# Patient Record
Sex: Female | Born: 1980 | Race: White | Hispanic: No | Marital: Married | Smoking: Never smoker
Health system: Southern US, Community
[De-identification: ages and names within clinical notes are randomized; demographics above are authoritative.]

## PROBLEM LIST (undated history)

## (undated) DIAGNOSIS — J45909 Unspecified asthma, uncomplicated: Secondary | ICD-10-CM

---

## 2008-05-15 ENCOUNTER — Inpatient Hospital Stay (HOSPITAL_COMMUNITY): Admission: RE | Admit: 2008-05-15 | Discharge: 2008-05-17 | Payer: Self-pay | Admitting: Obstetrics and Gynecology

## 2010-04-06 ENCOUNTER — Inpatient Hospital Stay (HOSPITAL_COMMUNITY): Admission: AD | Admit: 2010-04-06 | Discharge: 2010-04-06 | Payer: Self-pay | Admitting: Obstetrics

## 2010-06-20 ENCOUNTER — Inpatient Hospital Stay (HOSPITAL_COMMUNITY): Admission: AD | Admit: 2010-06-20 | Discharge: 2010-06-20 | Payer: Self-pay | Admitting: Obstetrics and Gynecology

## 2010-07-09 ENCOUNTER — Inpatient Hospital Stay (HOSPITAL_COMMUNITY)
Admission: RE | Admit: 2010-07-09 | Discharge: 2010-07-11 | Payer: Self-pay | Source: Home / Self Care | Attending: Obstetrics and Gynecology | Admitting: Obstetrics and Gynecology

## 2010-10-13 LAB — CBC
HCT: 31.9 % — ABNORMAL LOW (ref 36.0–46.0)
HCT: 35.2 % — ABNORMAL LOW (ref 36.0–46.0)
MCH: 31.5 pg (ref 26.0–34.0)
MCHC: 34 g/dL (ref 30.0–36.0)
MCHC: 34.5 g/dL (ref 30.0–36.0)
MCV: 91.4 fL (ref 78.0–100.0)
MCV: 92.2 fL (ref 78.0–100.0)
RDW: 13.4 % (ref 11.5–15.5)
RDW: 13.7 % (ref 11.5–15.5)
WBC: 12.2 10*3/uL — ABNORMAL HIGH (ref 4.0–10.5)

## 2010-12-15 NOTE — H&P (Signed)
NAME:  Mallory Dunn, Mallory Dunn               ACCOUNT NO.:  [id number]   MEDICAL RECORD NO.:  [id number]          PATIENT TYPE:  INP   LOCATION:  9165                          FACILITY:  [facility]   PHYSICIAN:  Lenoard Aden, M.D.DATE OF BIRTH:  [birth year]   DATE OF ADMISSION:  05/15/2008  DATE OF DISCHARGE:                              HISTORY & PHYSICAL   CHIEF COMPLAINT:  Favorable cervix, multiple musculoskeletal complaints,  for induction.   HISTORY OF PRESENT ILLNESS:  The patient is a 27-year white female G2,  P1 with a history of LGA and difficult delivery currently at 39 weeks  with favorable cervix desiring induction.   PAST MEDICAL HISTORY:  Remarkable for a vaginal delivery.   ALLERGIES:  She has no known drug allergies.   MEDICATIONS:  Prenatal vitamins.   FAMILY HISTORY:  She has a family history of heart disease, diabetes and  breast cancer.  First child was 8 pounds 9 ounces at 39 weeks.   PHYSICAL EXAMINATION:  GENERAL:  She is a well-developed, well-nourished  white female in no acute distress.  HEENT: Normal.  LUNGS: Clear.  HEART: Regular rate and rhythm.  ABDOMEN:  Soft, gravid, nontender.  Estimated fetal weight 8.8 pounds.  Cervix is 3, 50%, vertex -1, soft, anterior.  EXTREMITIES: There are no cords.  NEUROLOGIC: Nonfocal.  SKIN: Intact.   IMPRESSION:  Term intrauterine pregnancy at 39 weeks with multiple  musculoskeletal complaints and concerns about previous difficult  delivery, now with favorable cervix for induction 39 weeks.   PLAN:  Proceed with induction.  Pitocin epidural as needed.      Lenoard Aden, M.D.  Electronically Signed     RJT/MEDQ  D:  05/15/2008  T:  05/15/2008  Job:  [id number]

## 2011-05-04 LAB — CBC
Hemoglobin: 11.5 — ABNORMAL LOW
MCHC: 34.1
MCV: 92.7
Platelets: 128 — ABNORMAL LOW
RDW: 13.9
WBC: 12.9 — ABNORMAL HIGH

## 2012-01-04 IMAGING — CR DG CHEST 2V
2 series · 2 of 2 positions shown · non-contrast
Comparison: None

CLINICAL DATA: Cough and chest pain

CHEST - 2 VIEW

[view not recorded (1 of 2)]
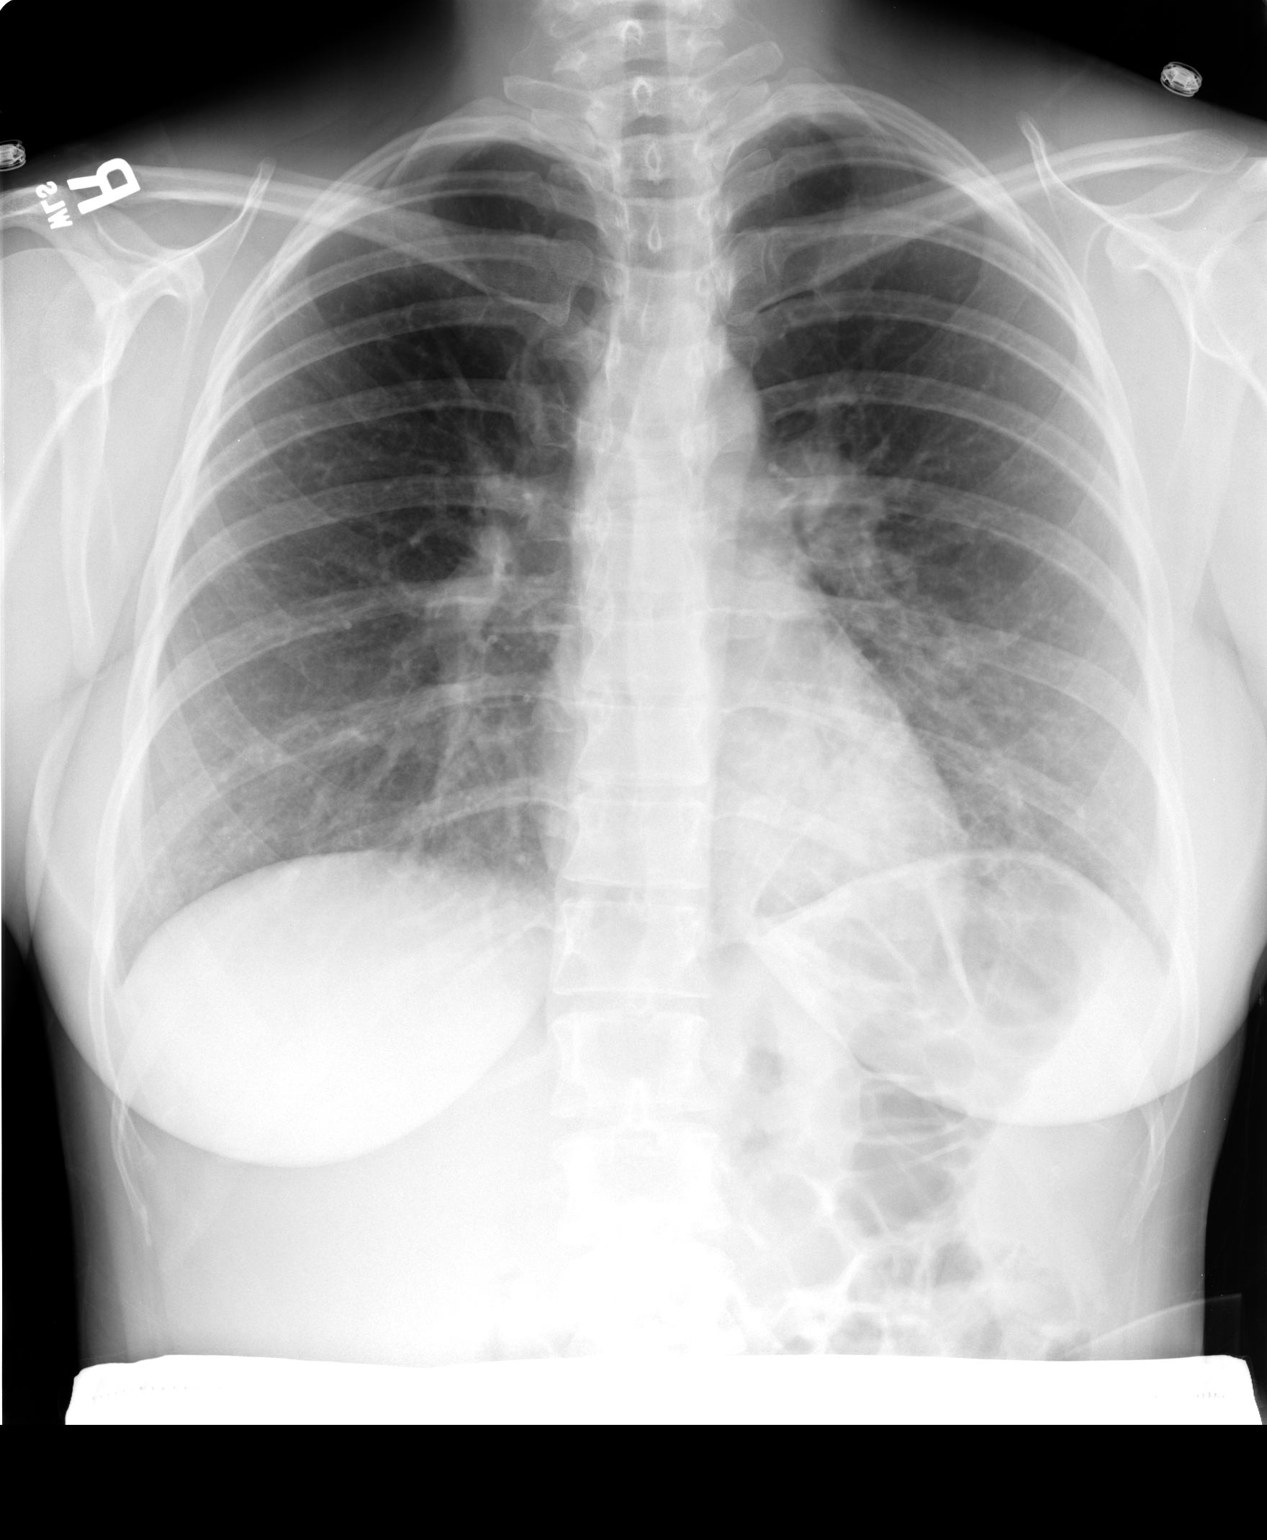

[view not recorded (2 of 2)]
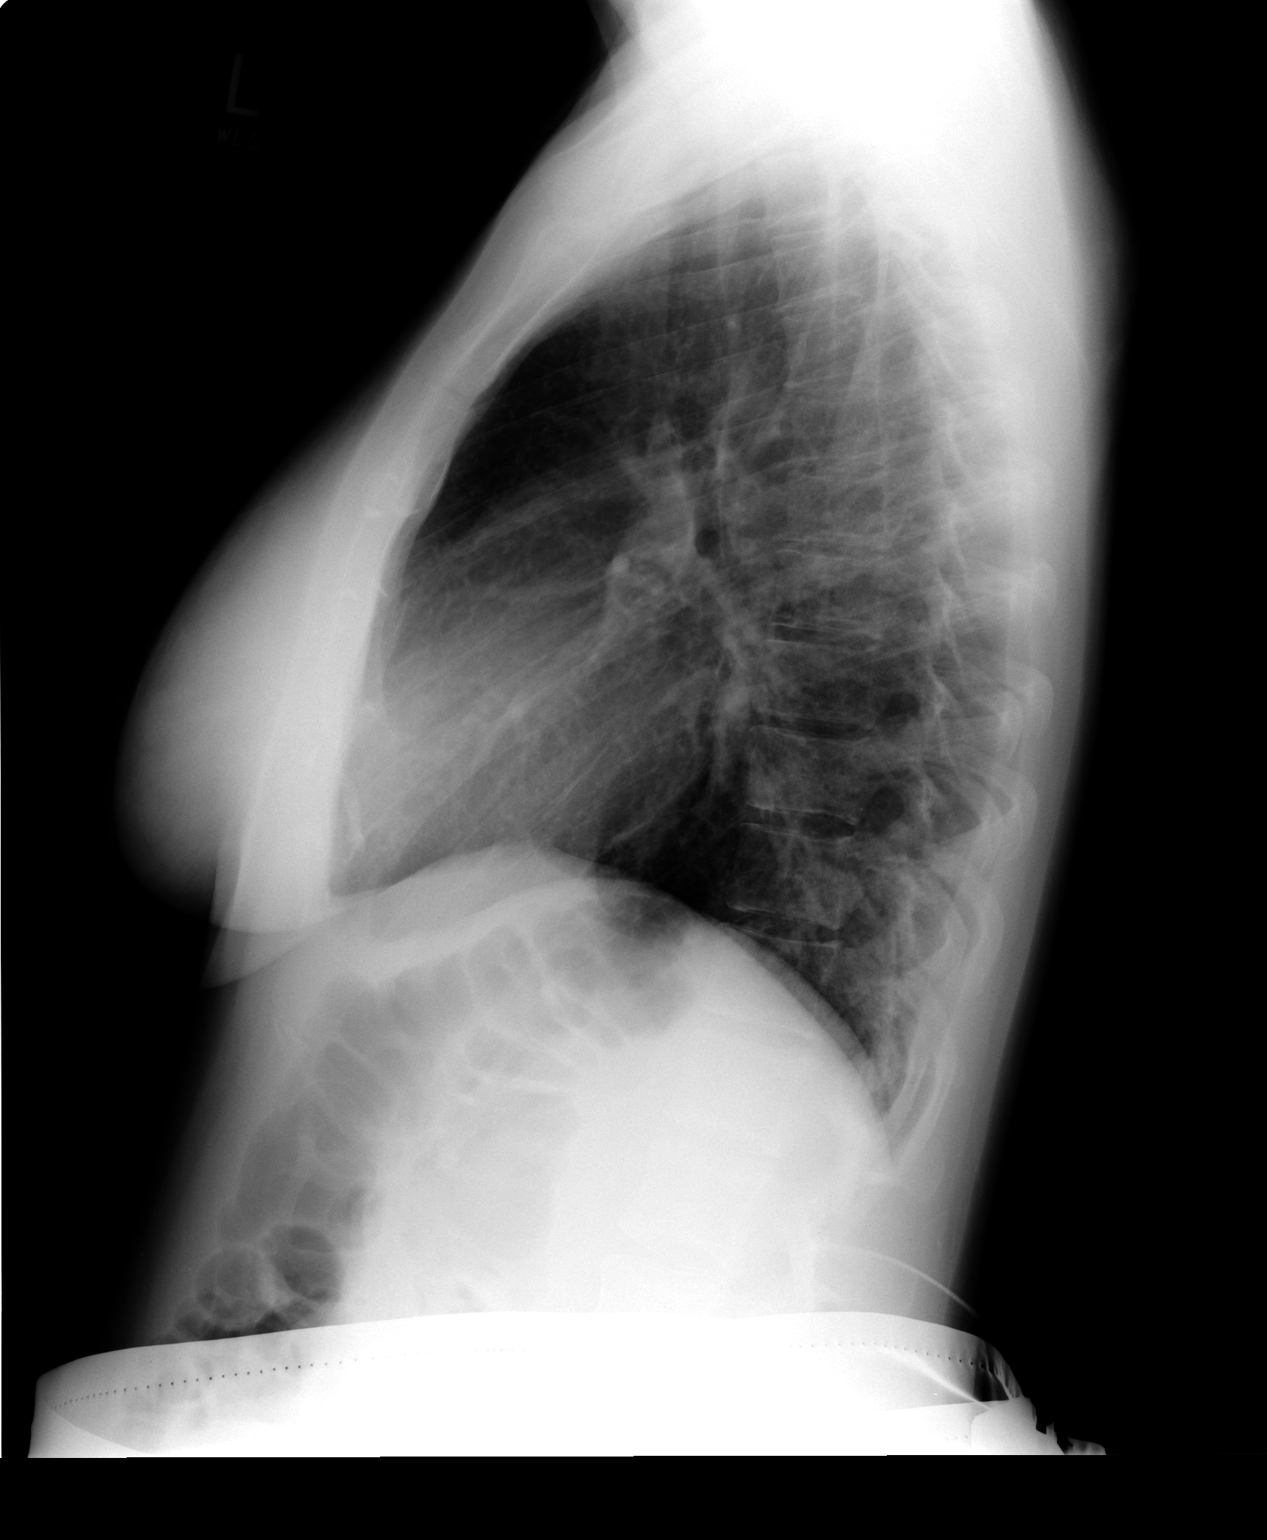

[2 of 2 positions shown; findings below may reference images not displayed]

FINDINGS: Heart size is normal.

No pleural effusion or pulmonary edema noted.

Patchy airspace densities are identified within the left lung base.

The right lung appears clear
IMPRESSION: 1.  Left base opacities are worrisome for infection.

## 2018-09-21 ENCOUNTER — Other Ambulatory Visit: Payer: Self-pay | Admitting: Obstetrics and Gynecology

## 2018-10-13 ENCOUNTER — Ambulatory Visit (HOSPITAL_BASED_OUTPATIENT_CLINIC_OR_DEPARTMENT_OTHER): Admit: 2018-10-13 | Payer: Self-pay | Admitting: Obstetrics and Gynecology

## 2018-10-13 ENCOUNTER — Encounter (HOSPITAL_BASED_OUTPATIENT_CLINIC_OR_DEPARTMENT_OTHER): Payer: Self-pay

## 2018-10-13 SURGERY — DILATATION & CURETTAGE/HYSTEROSCOPY WITH MYOSURE
Anesthesia: Choice

## 2018-11-15 ENCOUNTER — Inpatient Hospital Stay (HOSPITAL_COMMUNITY): Admission: RE | Admit: 2018-11-15 | Payer: Self-pay | Source: Ambulatory Visit

## 2018-11-23 ENCOUNTER — Encounter (HOSPITAL_BASED_OUTPATIENT_CLINIC_OR_DEPARTMENT_OTHER): Admission: RE | Payer: Self-pay | Source: Ambulatory Visit

## 2018-11-23 ENCOUNTER — Ambulatory Visit (HOSPITAL_BASED_OUTPATIENT_CLINIC_OR_DEPARTMENT_OTHER)
Admission: RE | Admit: 2018-11-23 | Payer: BLUE CROSS/BLUE SHIELD | Source: Ambulatory Visit | Admitting: Obstetrics and Gynecology

## 2018-11-23 SURGERY — XI ROBOTIC ASSISTED LAPAROSCOPIC HYSTERECTOMY AND SALPINGECTOMY
Anesthesia: General | Laterality: Bilateral

## 2018-12-28 ENCOUNTER — Other Ambulatory Visit: Payer: Self-pay | Admitting: Obstetrics and Gynecology

## 2019-01-17 NOTE — Patient Instructions (Addendum)
Mallory Dunn       Your procedure is scheduled on 01-22-2019   Report to [facility]  At 11:00  A.M.   Call this number if you have problems the morning of [phone]   OUR ADDRESS IS [address], WE ARE LOCATED IN THE [facility] WITH [facility].   Remember:  Do not eat food  after midnight.            CLEAR LIQUIDS FROM MIDNIGHT UNTIL 7:00 AM.            NOTHING BY MOUTH AFTER 7:00 AM.    CLEAR LIQUID DIET   Foods Allowed                                                                     Foods Excluded  Coffee and tea, regular and decaf                             liquids that you cannot  Plain Jell-O in any flavor                                             see through such as: Fruit ices (not with fruit pulp)                                     milk, soups, orange juice  Iced Popsicles                                    All solid food Carbonated beverages, regular and diet                                    Cranberry, grape and apple juices Sports drinks like Gatorade Lightly seasoned clear broth or consume(fat free) Sugar, honey syrup       ALBUTEROL INHALER IF NEEDED AND BRING INHALER    Do not wear jewelry, make-up or nail polish.  Do not wear lotions, powders, or perfumes, or deoderant.  Do not shave 48 hours prior to surgery.  Men may shave face and neck.  Do not bring valuables to the hospital.  [facility] is not responsible for any belongings or valuables.  Contacts, dentures or bridgework may not be worn into surgery.     Special instructions:   YOU ARE SPENDING THE NIGHT, BRING ALL PRESCRIPTION MEDICATIONS IN ORIGINAL CONTAINERS, NO VISITORS CAN SPEND THE NIGHT.  Please read over the following fact sheets that you were given:       [facility] - Preparing for Surgery  Before surgery, you can play an important role.   Because skin is not sterile, your skin needs to be as free of germs as  possible.   You can reduce the number of germs on your skin by washing with  CHG (chlorahexidine gluconate) soap before surgery.   CHG is an antiseptic cleaner which kills germs and bonds with the skin to continue killing germs even after washing. Please DO NOT use if you have an allergy to CHG or antibacterial soaps.   If your skin becomes reddened/irritated stop using the CHG and inform your nurse when you arrive at Short Stay. Do not shave (including legs and underarms) for at least 48 hours prior to the first CHG shower.  Please follow these instructions carefully:   1.  Shower with CHG Soap the night before surgery and the  morning of Surgery.  2.  If you choose to wash your hair, wash your hair first as usual with your  normal  shampoo.  3.  After you shampoo, rinse your hair and body thoroughly to remove the  shampoo.                                        4.  Use CHG as you would any other liquid soap.  You can apply chg directly  to the skin and wash                       Gently with a scrungie or clean washcloth.  5.  Apply the CHG Soap to your body ONLY FROM THE NECK DOWN.   Do not use on face/ open                           Wound or open sores. Avoid contact with eyes, ears mouth and genitals (private parts).                       Wash face,  Genitals (private parts) with your normal soap.             6.  Wash thoroughly, paying special attention to the area where your surgery  will be performed.  7.  Thoroughly rinse your body with warm water from the neck down.  8.  DO NOT shower/wash with your normal soap after using and rinsing off  the CHG Soap.             9.  Pat yourself dry with a clean towel.            10.  Wear clean pajamas.            11.  Place clean sheets on your bed the night of your first shower and do not  sleep with pets.  Day of Surgery : Do not apply any lotions/deodorants the morning of surgery.  Please wear clean clothes to the hospital/surgery  center.   FAILURE TO FOLLOW THESE INSTRUCTIONS MAY RESULT IN THE CANCELLATION OF YOUR SURGERY PATIENT SIGNATURE_________________________________  NURSE SIGNATURE__________________________________  ________________________________________________________________________

## 2019-01-18 ENCOUNTER — Other Ambulatory Visit (HOSPITAL_COMMUNITY)
Admission: RE | Admit: 2019-01-18 | Discharge: 2019-01-18 | Disposition: A | Discharge status: Home / Self Care | Payer: BLUE CROSS/BLUE SHIELD | Source: Ambulatory Visit | Attending: Obstetrics and Gynecology | Admitting: Obstetrics and Gynecology

## 2019-01-18 ENCOUNTER — Encounter (HOSPITAL_COMMUNITY)
Admission: RE | Admit: 2019-01-18 | Discharge: 2019-01-18 | Disposition: A | Discharge status: Home / Self Care | Payer: BLUE CROSS/BLUE SHIELD | Source: Ambulatory Visit | Attending: Obstetrics and Gynecology | Admitting: Obstetrics and Gynecology

## 2019-01-18 ENCOUNTER — Encounter (HOSPITAL_COMMUNITY): Payer: Self-pay

## 2019-01-18 ENCOUNTER — Other Ambulatory Visit: Payer: Self-pay

## 2019-01-18 DIAGNOSIS — Z1159 Encounter for screening for other viral diseases: Secondary | ICD-10-CM | POA: Diagnosis not present

## 2019-01-18 DIAGNOSIS — Z01812 Encounter for preprocedural laboratory examination: Secondary | ICD-10-CM | POA: Diagnosis present

## 2019-01-18 DIAGNOSIS — N879 Dysplasia of cervix uteri, unspecified: Secondary | ICD-10-CM | POA: Diagnosis not present

## 2019-01-18 DIAGNOSIS — N920 Excessive and frequent menstruation with regular cycle: Secondary | ICD-10-CM | POA: Diagnosis not present

## 2019-01-18 HISTORY — DX: Unspecified asthma, uncomplicated: J45.909

## 2019-01-18 LAB — BASIC METABOLIC PANEL
Anion gap: 11 (ref 5–15)
BUN: 18 mg/dL (ref 6–20)
CO2: 24 mmol/L (ref 22–32)
Calcium: 9.3 mg/dL (ref 8.9–10.3)
Chloride: 105 mmol/L (ref 98–111)
Creatinine, Ser: 0.76 mg/dL (ref 0.44–1.00)
GFR calc Af Amer: >60 mL/min (ref >60)
GFR calc non Af Amer: >60 mL/min (ref >60)
Glucose, Bld: 92 mg/dL (ref 70–99)
Potassium: 4 mmol/L (ref 3.5–5.1)
Sodium: 140 mmol/L (ref 135–145)

## 2019-01-18 LAB — CBC
HCT: 38.6 % (ref 36.0–46.0)
Hemoglobin: 12.2 g/dL (ref 12.0–15.0)
MCH: 29.1 pg (ref 26.0–34.0)
MCHC: 31.6 g/dL (ref 30.0–36.0)
MCV: 92.1 fL (ref 80.0–100.0)
Platelets: 252 10*3/uL (ref 150–400)
RBC: 4.19 MIL/uL (ref 3.87–5.11)
RDW: 13 % (ref 11.5–15.5)
WBC: 6.7 10*3/uL (ref 4.0–10.5)
nRBC: 0 % (ref 0.0–0.2)

## 2019-01-19 LAB — SARS CORONAVIRUS 2 (TAT 6-24 HRS): SARS Coronavirus 2: NEGATIVE

## 2019-01-22 ENCOUNTER — Ambulatory Visit (HOSPITAL_BASED_OUTPATIENT_CLINIC_OR_DEPARTMENT_OTHER)
Admission: RE | Admit: 2019-01-22 | Discharge: 2019-01-23 | Disposition: A | Discharge status: Home / Self Care | Payer: BLUE CROSS/BLUE SHIELD | Source: Ambulatory Visit | Attending: Obstetrics and Gynecology | Admitting: Obstetrics and Gynecology

## 2019-01-22 ENCOUNTER — Encounter (HOSPITAL_BASED_OUTPATIENT_CLINIC_OR_DEPARTMENT_OTHER)
Admission: RE | Disposition: A | Discharge status: Home / Self Care | Payer: Self-pay | Source: Ambulatory Visit | Attending: Obstetrics and Gynecology

## 2019-01-22 ENCOUNTER — Ambulatory Visit (HOSPITAL_BASED_OUTPATIENT_CLINIC_OR_DEPARTMENT_OTHER): Payer: BLUE CROSS/BLUE SHIELD | Admitting: Anesthesiology

## 2019-01-22 ENCOUNTER — Encounter (HOSPITAL_BASED_OUTPATIENT_CLINIC_OR_DEPARTMENT_OTHER): Payer: Self-pay | Admitting: *Deleted

## 2019-01-22 ENCOUNTER — Ambulatory Visit (HOSPITAL_BASED_OUTPATIENT_CLINIC_OR_DEPARTMENT_OTHER): Payer: BLUE CROSS/BLUE SHIELD | Admitting: Physician Assistant

## 2019-01-22 ENCOUNTER — Other Ambulatory Visit: Payer: Self-pay

## 2019-01-22 DIAGNOSIS — N939 Abnormal uterine and vaginal bleeding, unspecified: Secondary | ICD-10-CM | POA: Diagnosis not present

## 2019-01-22 DIAGNOSIS — J45990 Exercise induced bronchospasm: Secondary | ICD-10-CM | POA: Diagnosis not present

## 2019-01-22 DIAGNOSIS — Z7951 Long term (current) use of inhaled steroids: Secondary | ICD-10-CM | POA: Insufficient documentation

## 2019-01-22 DIAGNOSIS — N920 Excessive and frequent menstruation with regular cycle: Secondary | ICD-10-CM | POA: Diagnosis not present

## 2019-01-22 DIAGNOSIS — N946 Dysmenorrhea, unspecified: Secondary | ICD-10-CM | POA: Diagnosis not present

## 2019-01-22 DIAGNOSIS — D259 Leiomyoma of uterus, unspecified: Secondary | ICD-10-CM | POA: Insufficient documentation

## 2019-01-22 DIAGNOSIS — N736 Female pelvic peritoneal adhesions (postinfective): Secondary | ICD-10-CM | POA: Insufficient documentation

## 2019-01-22 DIAGNOSIS — R87613 High grade squamous intraepithelial lesion on cytologic smear of cervix (HGSIL): Secondary | ICD-10-CM | POA: Diagnosis not present

## 2019-01-22 DIAGNOSIS — Z79899 Other long term (current) drug therapy: Secondary | ICD-10-CM | POA: Diagnosis not present

## 2019-01-22 DIAGNOSIS — Z791 Long term (current) use of non-steroidal anti-inflammatories (NSAID): Secondary | ICD-10-CM | POA: Insufficient documentation

## 2019-01-22 DIAGNOSIS — K469 Unspecified abdominal hernia without obstruction or gangrene: Secondary | ICD-10-CM | POA: Diagnosis not present

## 2019-01-22 HISTORY — PX: ROBOTIC ASSISTED LAPAROSCOPIC HYSTERECTOMY AND SALPINGECTOMY: SHX6379

## 2019-01-22 LAB — CBC
HCT: 35.4 % — ABNORMAL LOW (ref 36.0–46.0)
Hemoglobin: 11.3 g/dL — ABNORMAL LOW (ref 12.0–15.0)
MCH: 29.1 pg (ref 26.0–34.0)
MCHC: 31.9 g/dL (ref 30.0–36.0)
MCV: 91.2 fL (ref 80.0–100.0)
Platelets: 208 10*3/uL (ref 150–400)
RBC: 3.88 MIL/uL (ref 3.87–5.11)
RDW: 12.8 % (ref 11.5–15.5)
WBC: 10.4 10*3/uL (ref 4.0–10.5)
nRBC: 0 % (ref 0.0–0.2)

## 2019-01-22 LAB — POCT PREGNANCY, URINE: Preg Test, Ur: NEGATIVE

## 2019-01-22 SURGERY — XI ROBOTIC ASSISTED LAPAROSCOPIC HYSTERECTOMY AND SALPINGECTOMY
Anesthesia: General | Site: Abdomen | Laterality: Bilateral

## 2019-01-22 MED ORDER — NALOXONE HCL 0.4 MG/ML IJ SOLN
0.4000 mg | INTRAMUSCULAR | Status: DC | PRN
  Filled 2019-01-22: qty 1

## 2019-01-22 MED ORDER — ACETAMINOPHEN 10 MG/ML IV SOLN
INTRAVENOUS | Status: AC
  Filled 2019-01-22: qty 100

## 2019-01-22 MED ORDER — MIDAZOLAM HCL 2 MG/2ML IJ SOLN
INTRAMUSCULAR | Status: AC
  Filled 2019-01-22: qty 2

## 2019-01-22 MED ORDER — FENTANYL CITRATE (PF) 100 MCG/2ML IJ SOLN
INTRAMUSCULAR | Status: AC
  Filled 2019-01-22: qty 2

## 2019-01-22 MED ORDER — ONDANSETRON HCL 4 MG/2ML IJ SOLN
4.0000 mg | Freq: Four times a day (QID) | INTRAMUSCULAR | Status: DC | PRN
  Filled 2019-01-22: qty 2

## 2019-01-22 MED ORDER — CEFAZOLIN SODIUM-DEXTROSE 2-4 GM/100ML-% IV SOLN
INTRAVENOUS | Status: AC
  Filled 2019-01-22: qty 100

## 2019-01-22 MED ORDER — PROPOFOL 10 MG/ML IV BOLUS
INTRAVENOUS | Status: AC
  Filled 2019-01-22: qty 20

## 2019-01-22 MED ORDER — SUGAMMADEX SODIUM 200 MG/2ML IV SOLN
INTRAVENOUS | Status: AC
  Filled 2019-01-22: qty 2

## 2019-01-22 MED ORDER — KETOROLAC TROMETHAMINE 30 MG/ML IJ SOLN
30.0000 mg | Freq: Four times a day (QID) | INTRAMUSCULAR | Status: DC
  Administered 2019-01-22 – 2019-01-23 (×3): 30 mg via INTRAVENOUS
  Filled 2019-01-22: qty 1

## 2019-01-22 MED ORDER — KETOROLAC TROMETHAMINE 30 MG/ML IJ SOLN
INTRAMUSCULAR | Status: AC
  Filled 2019-01-22: qty 1

## 2019-01-22 MED ORDER — ACETAMINOPHEN 10 MG/ML IV SOLN
1000.0000 mg | Freq: Once | INTRAVENOUS | Status: DC | PRN
  Administered 2019-01-22: 1000 mg via INTRAVENOUS
  Filled 2019-01-22: qty 100

## 2019-01-22 MED ORDER — ROCURONIUM BROMIDE 10 MG/ML (PF) SYRINGE
PREFILLED_SYRINGE | INTRAVENOUS | Status: AC
  Filled 2019-01-22: qty 10

## 2019-01-22 MED ORDER — DEXAMETHASONE SODIUM PHOSPHATE 10 MG/ML IJ SOLN
INTRAMUSCULAR | Status: DC | PRN
  Administered 2019-01-22: 10 mg via INTRAVENOUS

## 2019-01-22 MED ORDER — OXYCODONE HCL 5 MG/5ML PO SOLN
5.0000 mg | Freq: Once | ORAL | Status: DC | PRN
  Filled 2019-01-22: qty 5

## 2019-01-22 MED ORDER — LIDOCAINE 2% (20 MG/ML) 5 ML SYRINGE
INTRAMUSCULAR | Status: AC
  Filled 2019-01-22: qty 5

## 2019-01-22 MED ORDER — PROPOFOL 10 MG/ML IV BOLUS
INTRAVENOUS | Status: DC | PRN
  Administered 2019-01-22: 150 mg via INTRAVENOUS

## 2019-01-22 MED ORDER — LACTATED RINGERS IV SOLN
INTRAVENOUS | Status: DC
  Administered 2019-01-22 (×2): via INTRAVENOUS
  Filled 2019-01-22: qty 1000

## 2019-01-22 MED ORDER — OXYCODONE-ACETAMINOPHEN 5-325 MG PO TABS
ORAL_TABLET | ORAL | Status: AC
  Filled 2019-01-22: qty 1

## 2019-01-22 MED ORDER — BUPIVACAINE HCL (PF) 0.25 % IJ SOLN
INTRAMUSCULAR | Status: DC | PRN
  Administered 2019-01-22: 15 mL

## 2019-01-22 MED ORDER — SODIUM CHLORIDE 0.9 % IV SOLN
INTRAVENOUS | Status: DC | PRN
  Administered 2019-01-22: 60 mL

## 2019-01-22 MED ORDER — DEXTROSE IN LACTATED RINGERS 5 % IV SOLN
INTRAVENOUS | Status: DC
  Administered 2019-01-22: 18:00:00 via INTRAVENOUS
  Filled 2019-01-22 (×2): qty 1000

## 2019-01-22 MED ORDER — DIPHENHYDRAMINE HCL 50 MG/ML IJ SOLN
12.5000 mg | Freq: Four times a day (QID) | INTRAMUSCULAR | Status: DC | PRN
  Filled 2019-01-22: qty 0.25

## 2019-01-22 MED ORDER — OXYCODONE-ACETAMINOPHEN 5-325 MG PO TABS
1.0000 | ORAL_TABLET | ORAL | Status: DC | PRN
  Administered 2019-01-22: 1 via ORAL
  Administered 2019-01-23 (×2): 2 via ORAL
  Filled 2019-01-22: qty 2

## 2019-01-22 MED ORDER — SUGAMMADEX SODIUM 200 MG/2ML IV SOLN
INTRAVENOUS | Status: DC | PRN
  Administered 2019-01-22: 140 mg via INTRAVENOUS

## 2019-01-22 MED ORDER — DEXMEDETOMIDINE HCL 200 MCG/2ML IV SOLN
40.0000 ug | Freq: Once | INTRAVENOUS | Status: AC
  Administered 2019-01-22: 16:00:00 40 ug via INTRAVENOUS
  Filled 2019-01-22: qty 0.4

## 2019-01-22 MED ORDER — CEFAZOLIN SODIUM-DEXTROSE 2-4 GM/100ML-% IV SOLN
2.0000 g | INTRAVENOUS | Status: AC
  Administered 2019-01-22: 13:00:00 2 g via INTRAVENOUS
  Filled 2019-01-22: qty 100

## 2019-01-22 MED ORDER — ARTIFICIAL TEARS OPHTHALMIC OINT
TOPICAL_OINTMENT | OPHTHALMIC | Status: AC
  Filled 2019-01-22: qty 3.5

## 2019-01-22 MED ORDER — MIDAZOLAM HCL 2 MG/2ML IJ SOLN
1.0000 mg | Freq: Once | INTRAMUSCULAR | Status: AC
  Administered 2019-01-22: 1 mg via INTRAVENOUS
  Filled 2019-01-22: qty 1

## 2019-01-22 MED ORDER — MIDAZOLAM HCL 5 MG/5ML IJ SOLN
INTRAMUSCULAR | Status: DC | PRN
  Administered 2019-01-22: 2 mg via INTRAVENOUS

## 2019-01-22 MED ORDER — FENTANYL CITRATE (PF) 100 MCG/2ML IJ SOLN
INTRAMUSCULAR | Status: DC | PRN
  Administered 2019-01-22 (×5): 50 ug via INTRAVENOUS

## 2019-01-22 MED ORDER — OXYCODONE HCL 5 MG PO TABS
5.0000 mg | ORAL_TABLET | Freq: Once | ORAL | Status: DC | PRN
  Filled 2019-01-22: qty 1

## 2019-01-22 MED ORDER — ROCURONIUM BROMIDE 50 MG/5ML IV SOSY
PREFILLED_SYRINGE | INTRAVENOUS | Status: DC | PRN
  Administered 2019-01-22: 40 mg via INTRAVENOUS
  Administered 2019-01-22: 10 mg via INTRAVENOUS
  Administered 2019-01-22: 20 mg via INTRAVENOUS

## 2019-01-22 MED ORDER — SODIUM CHLORIDE 0.9% FLUSH
9.0000 mL | INTRAVENOUS | Status: DC | PRN
  Filled 2019-01-22: qty 10

## 2019-01-22 MED ORDER — DEXAMETHASONE SODIUM PHOSPHATE 10 MG/ML IJ SOLN
INTRAMUSCULAR | Status: AC
  Filled 2019-01-22: qty 1

## 2019-01-22 MED ORDER — KETOROLAC TROMETHAMINE 30 MG/ML IJ SOLN
INTRAMUSCULAR | Status: DC | PRN
  Administered 2019-01-22: 30 mg via INTRAVENOUS

## 2019-01-22 MED ORDER — FENTANYL CITRATE (PF) 250 MCG/5ML IJ SOLN
INTRAMUSCULAR | Status: AC
  Filled 2019-01-22: qty 5

## 2019-01-22 MED ORDER — LIDOCAINE 2% (20 MG/ML) 5 ML SYRINGE
INTRAMUSCULAR | Status: DC | PRN
  Administered 2019-01-22: 100 mg via INTRAVENOUS

## 2019-01-22 MED ORDER — IBUPROFEN 800 MG PO TABS
800.0000 mg | ORAL_TABLET | Freq: Four times a day (QID) | ORAL | Status: DC
  Filled 2019-01-22: qty 1

## 2019-01-22 MED ORDER — ONDANSETRON HCL 4 MG/2ML IJ SOLN
4.0000 mg | Freq: Once | INTRAMUSCULAR | Status: DC | PRN
  Filled 2019-01-22: qty 2

## 2019-01-22 MED ORDER — DIPHENHYDRAMINE HCL 12.5 MG/5ML PO ELIX
12.5000 mg | ORAL_SOLUTION | Freq: Four times a day (QID) | ORAL | Status: DC | PRN
  Filled 2019-01-22: qty 5

## 2019-01-22 MED ORDER — HYDROMORPHONE 1 MG/ML IV SOLN
INTRAVENOUS | Status: DC
  Administered 2019-01-22: 0.8 mg via INTRAVENOUS
  Administered 2019-01-22: 30 mg via INTRAVENOUS
  Administered 2019-01-23: 1.2 mg via INTRAVENOUS
  Filled 2019-01-22 (×2): qty 30

## 2019-01-22 MED ORDER — SODIUM CHLORIDE 0.9 % IR SOLN
Status: DC | PRN
  Administered 2019-01-22: 3000 mL

## 2019-01-22 MED ORDER — ONDANSETRON HCL 4 MG/2ML IJ SOLN
INTRAMUSCULAR | Status: AC
  Filled 2019-01-22: qty 2

## 2019-01-22 MED ORDER — FENTANYL CITRATE (PF) 100 MCG/2ML IJ SOLN
25.0000 ug | INTRAMUSCULAR | Status: DC | PRN
  Administered 2019-01-22 (×3): 50 ug via INTRAVENOUS
  Filled 2019-01-22: qty 1

## 2019-01-22 SURGICAL SUPPLY — 61 items
BARRIER ADHS 3X4 INTERCEED (GAUZE/BANDAGES/DRESSINGS) IMPLANT
CANISTER SUCT 3000ML PPV (MISCELLANEOUS) ×3 IMPLANT
CATH FOLEY 3WAY  5CC 16FR (CATHETERS) ×2
CATH FOLEY 3WAY 5CC 16FR (CATHETERS) ×1 IMPLANT
COVER BACK TABLE 60X90IN (DRAPES) ×3 IMPLANT
COVER TIP SHEARS 8 DVNC (MISCELLANEOUS) ×1 IMPLANT
COVER TIP SHEARS 8MM DA VINCI (MISCELLANEOUS) ×2
COVER WAND RF STERILE (DRAPES) ×3 IMPLANT
DECANTER SPIKE VIAL GLASS SM (MISCELLANEOUS) ×2 IMPLANT
DEFOGGER SCOPE WARMER CLEARIFY (MISCELLANEOUS) ×3 IMPLANT
DERMABOND ADVANCED (GAUZE/BANDAGES/DRESSINGS) ×2
DERMABOND ADVANCED .7 DNX12 (GAUZE/BANDAGES/DRESSINGS) ×1 IMPLANT
DRAPE ARM DVNC X/XI (DISPOSABLE) ×4 IMPLANT
DRAPE COLUMN DVNC XI (DISPOSABLE) ×1 IMPLANT
DRAPE DA VINCI XI ARM (DISPOSABLE) ×8
DRAPE DA VINCI XI COLUMN (DISPOSABLE) ×2
DURAPREP 26ML APPLICATOR (WOUND CARE) ×3 IMPLANT
ELECT REM PT RETURN 9FT ADLT (ELECTROSURGICAL) ×3
ELECTRODE REM PT RTRN 9FT ADLT (ELECTROSURGICAL) ×1 IMPLANT
GAUZE 4X4 16PLY RFD (DISPOSABLE) ×3 IMPLANT
GLOVE BIO SURGEON STRL SZ 6.5 (GLOVE) ×1 IMPLANT
GLOVE BIO SURGEON STRL SZ7 (GLOVE) ×8 IMPLANT
GLOVE BIO SURGEON STRL SZ7.5 (GLOVE) ×9 IMPLANT
GLOVE BIO SURGEONS STRL SZ 6.5 (GLOVE) ×1
GLOVE BIOGEL PI IND STRL 7.0 (GLOVE) IMPLANT
GLOVE BIOGEL PI IND STRL 7.5 (GLOVE) IMPLANT
GLOVE BIOGEL PI INDICATOR 7.0 (GLOVE) ×6
GLOVE BIOGEL PI INDICATOR 7.5 (GLOVE) ×4
HOLDER FOLEY CATH W/STRAP (MISCELLANEOUS) ×2 IMPLANT
IRRIG SUCT STRYKERFLOW 2 WTIP (MISCELLANEOUS) ×3
IRRIGATION SUCT STRKRFLW 2 WTP (MISCELLANEOUS) ×1 IMPLANT
NEEDLE INSUFFLATION 150MM (ENDOMECHANICALS) ×3 IMPLANT
OBTURATOR OPTICAL STANDARD 8MM (TROCAR) ×2
OBTURATOR OPTICAL STND 8 DVNC (TROCAR) ×1
OBTURATOR OPTICALSTD 8 DVNC (TROCAR) ×1 IMPLANT
OCCLUDER COLPOPNEUMO (BALLOONS) ×3 IMPLANT
PACK ROBOT WH (CUSTOM PROCEDURE TRAY) ×3 IMPLANT
PACK ROBOTIC GOWN (GOWN DISPOSABLE) ×3 IMPLANT
PACK TRENDGUARD 450 HYBRID PRO (MISCELLANEOUS) IMPLANT
PAD PREP 24X48 CUFFED NSTRL (MISCELLANEOUS) ×3 IMPLANT
PROTECTOR NERVE ULNAR (MISCELLANEOUS) ×4 IMPLANT
SEAL CANN UNIV 5-8 DVNC XI (MISCELLANEOUS) ×4 IMPLANT
SEAL XI 5MM-8MM UNIVERSAL (MISCELLANEOUS) ×8
SET CYSTO W/LG BORE CLAMP LF (SET/KITS/TRAYS/PACK) IMPLANT
SET TRI-LUMEN FLTR TB AIRSEAL (TUBING) ×3 IMPLANT
SPONGE LAP 4X18 RFD (DISPOSABLE) IMPLANT
SUT VIC AB 0 CT1 27 (SUTURE) ×2
SUT VIC AB 0 CT1 27XBRD ANBCTR (SUTURE) ×1 IMPLANT
SUT VICRYL 0 UR6 27IN ABS (SUTURE) ×3 IMPLANT
SUT VICRYL RAPIDE 4/0 PS 2 (SUTURE) ×6 IMPLANT
SUT VLOC 180 0 9IN  GS21 (SUTURE) ×2
SUT VLOC 180 0 9IN GS21 (SUTURE) ×1 IMPLANT
TIP RUMI ORANGE 6.7MMX12CM (TIP) IMPLANT
TIP UTERINE 5.1X6CM LAV DISP (MISCELLANEOUS) IMPLANT
TIP UTERINE 6.7X10CM GRN DISP (MISCELLANEOUS) IMPLANT
TIP UTERINE 6.7X6CM WHT DISP (MISCELLANEOUS) IMPLANT
TIP UTERINE 6.7X8CM BLUE DISP (MISCELLANEOUS) ×2 IMPLANT
TOWEL OR 17X26 10 PK STRL BLUE (TOWEL DISPOSABLE) ×3 IMPLANT
TRENDGUARD 450 HYBRID PRO PACK (MISCELLANEOUS) ×3
TROCAR PORT AIRSEAL 8X120 (TROCAR) ×3 IMPLANT
WATER STERILE IRR 1000ML POUR (IV SOLUTION) ×3 IMPLANT

## 2019-01-22 NOTE — Op Note (Signed)
01/22/2019  2:37 PM  PATIENT:  Mallory Dunn  38 y.o. female  PRE-OPERATIVE DIAGNOSIS:  Abnormal Uterine Bleeding, HGSIL  POST-OPERATIVE DIAGNOSIS:  Abnormal Uterine Bleeding, HGSIL  PROCEDURE:  Procedure(s): XI ROBOTIC ASSISTED LAPAROSCOPIC HYSTERECTOMY  BILATERAL SALPINGECTOMY LYSIS OF SIGMOID TO LEFT ADNEXAL ADHESIONS. MCCALL CUL DE PLASTY  SURGEON:  Surgeon(s): Brien Few, MD  ASSISTANTS: Mel Almond, FACM , CNM,   ANESTHESIA:   local and general  ESTIMATED BLOOD LOSS: 25 mL   DRAINS: Urinary Catheter (Foley)   LOCAL MEDICATIONS USED:  MARCAINE    and Amount: 20 ml  SPECIMEN:  Source of Specimen:  UTERUS , TUBES AND OVARIES  DISPOSITION OF SPECIMEN:  PATHOLOGY  COUNTS:  YES  DICTATION #: 00  PLAN OF CARE: 23 HR EXTENDED STAY  PATIENT DISPOSITION:  PACU - hemodynamically stable.

## 2019-01-22 NOTE — Anesthesia Procedure Notes (Signed)
Procedure Name: Intubation Date/Time: 01/22/2019 12:56 PM Performed by: Bonney Aid, CRNA Pre-anesthesia Checklist: Patient identified, Emergency Drugs available, Suction available and Patient being monitored Patient Re-evaluated:Patient Re-evaluated prior to induction Oxygen Delivery Method: Circle system utilized Preoxygenation: Pre-oxygenation with 100% oxygen Induction Type: IV induction Ventilation: Mask ventilation without difficulty Laryngoscope Size: Mac and 3 Grade View: Grade I Tube type: Oral Tube size: 7.0 mm Number of attempts: 1 Airway Equipment and Method: Stylet and Oral airway Placement Confirmation: ETT inserted through vocal cords under direct vision,  positive ETCO2 and breath sounds checked- equal and bilateral Secured at: 20 cm Tube secured with: Tape Dental Injury: Teeth and Oropharynx as per pre-operative assessment

## 2019-01-22 NOTE — Op Note (Signed)
NAME: Mallory Dunn, Mallory Dunn MEDICAL RECORD QM:[id number] ACCOUNT [id number] DATE OF BIRTH:07/05/1981 FACILITY: [facility] LOCATION: [facility] PHYSICIAN:Maxamilian Amadon J. Kyndahl Jablon, MD  OPERATIVE REPORT  DATE OF PROCEDURE:  01/22/2019  PREOPERATIVE DIAGNOSES:  Dysmenorrhea and menorrhagia, history of high-grade abnormal Pap smear, uterine fibroids.  POSTOPERATIVE DIAGNOSES:  Dysmenorrhea and menorrhagia, history of high-grade abnormal Pap smear, uterine fibroids, left sigmoid to left adnexal adhesions and enterocele.  PROCEDURE:  Da Vinci-assisted total laparoscopic hysterectomy, bilateral salpingectomy, lysis of sigmoid to left adnexal adhesions, McCall culdoplasty.  SURGEON:  Brien Few, MD  ASSISTANT:  Mel Almond, CNM   ESTIMATED BLOOD LOSS:  Less than 50 mL.  COMPLICATIONS:  None.  DISPOSITION:  The patient was taken to recovery in good condition.    SPECIMEN:  170 gram uterus to pathology with bilateral normal appearing tubes.    The patient's consent is signed in the holding area.  She was apprised of the risks of anesthesia, infection, bleeding, injury to surrounding organs, possible need for repair, delayed versus immediate complications including bowel, bladder, injury,  possible need for repair.  At this time, she was brought to the operating room where she was administered general anesthetic without complications.  Prepped and draped in usual sterile fashion.  Foley catheter placed.  Exam under anesthesia revealing a  retroflexed bulky uterus and no adnexal masses.  RUMI retractor placed without difficulty.  At this time, dilute Marcaine solution placed.  An infraumbilical incision made with a scalpel.  Veress needle placed.  Opening pressure -4.  Then 3-1/2 liters of  CO2 insufflated without difficulty.  Trocar placed atraumatically.  Two robotic trocars, 1 on the right and 1 on the left and an 8 mm AirSeal port on the left.  At this time, deep Trendelenburg position was established after the  camera was placed.   Pictures were taken.  Normal anterior and posterior cul-de-sac.  Adhesions of the left sigmoid colon into the left adnexa and questionable enterocele was noted.  At this time, the left tube was undermined by unipolar cautery after docking the robot in  the standard fashion with placement of the EndoShears and long bipolar forceps.  Fimbriated end was then detached using bipolar and unipolar cautery.  The ureter was identified.  The retroperitoneal space was entered on the left cephalad to the round  ligament.  The tubo-ovarian ligament was isolated, cauterized and divided using bipolar cautery.  The round ligament was opened using unipolar and bipolar cautery with sharp dissection and the bladder flap was developed sharply skeletonizing the uterine  vessels on the left, which were cauterized using bipolar cautery in a 3-point method.  The bladder flap was further developed around the RUMI cup and attention was turned to the right side where the right tube was undermined and divided similar to the  left.  The round ligament was entered after development of the retroperitoneal space.  The bladder flap was further developed and the uterine vessels were skeletonized on the right.  They are cauterized and divided using unipolar and bipolar cautery.   The uterine artery on the left was then divided using sharp dissection.  The specimen is detached circumferentially at the level of the RUMI cup using unipolar cautery and specimen was retracted into the vagina.  Incision then closed using a 0 V-Loc  suture.  McCall culdoplasty suture was placed.  Hemostasis was achieved.  Irrigation accomplished.  Urine was clear.  Both ureters were seen peristalsing normally bilaterally.  At this time, a dilute ropivacaine solution was placed.  All instruments were  removed, robot undocked.  All instruments were removed under direct visualization.  CO2 released.  Positive pressure applied.  Incisions closed  using 0 Vicryl, 4-0 Vicryl and Dermabond.  Vaginal exam reveals the cuff to be well-approximated.  Good urine  output, which is also clear.  The patient tolerated the procedure well, was awakened and transferred to recovery in good condition.  TN/NUANCE  D:01/22/2019 T:01/22/2019 JOB:[id number]

## 2019-01-22 NOTE — Anesthesia Postprocedure Evaluation (Signed)
Anesthesia Post Note  Patient: Mallory Dunn  Procedure(s) Performed: XI ROBOTIC ASSISTED LAPAROSCOPIC HYSTERECTOMY AND SALPINGECTOMY (Bilateral Abdomen)     Patient location during evaluation: PACU Anesthesia Type: General Level of consciousness: awake and alert Pain management: pain level controlled Vital Signs Assessment: post-procedure vital signs reviewed and stable Respiratory status: spontaneous breathing, nonlabored ventilation, respiratory function stable and patient connected to nasal cannula oxygen Cardiovascular status: blood pressure returned to baseline and stable Postop Assessment: no apparent nausea or vomiting Anesthetic complications: no    Last Vitals:  Vitals:   01/22/19 1515 01/22/19 1530  BP: 121/79 116/78  Pulse: 66 74  Resp: 14 14  Temp:    SpO2: 99% 97%    Last Pain:  Vitals:   01/22/19 1524  TempSrc:   PainSc: 7                  Ondrea Dow COKER

## 2019-01-22 NOTE — H&P (Signed)
Mallory Dunn is an 38 y.o. female. Refractory menorrhagia. Fibroids. Desires definitive surgery.   Pertinent Gynecological History: Menses: flow is excessive with use of 3 pads or tampons on heaviest days Bleeding: dysfunctional uterine bleeding Contraception: none DES exposure: denies Blood transfusions: none Sexually transmitted diseases: no past history Previous GYN Procedures: DNC  Last mammogram: normal Date: 2020 Last pap: normal Date: 2020 OB History: G3, P3   Menstrual History: Menarche age: 84 Patient's last menstrual period was 01/15/2019.    Past Medical History:  Diagnosis Date  . Asthma    excersised indu    History reviewed. No pertinent surgical history.  History reviewed. No pertinent family history.  Social History:  reports that she has never smoked. She has never used smokeless tobacco. She reports that she does not drink alcohol or use drugs.  Allergies: No Known Allergies  Medications Prior to Admission  Medication Sig Dispense Refill Last Dose  . ibuprofen (ADVIL,MOTRIN) 200 MG tablet Take 400 mg by mouth every 6 (six) hours as needed for moderate pain.   Past Week at Unknown time  . albuterol (PROVENTIL HFA;VENTOLIN HFA) 108 (90 Base) MCG/ACT inhaler Inhale 1-2 puffs into the lungs every 6 (six) hours as needed for wheezing or shortness of breath.   More than a month at Unknown time    Review of Systems  Constitutional: Negative.   All other systems reviewed and are negative.   Blood pressure 108/70, pulse 64, temperature 98.5 F (36.9 C), temperature source Oral, resp. rate 18, height 5' 4.5" (1.638 m), weight 68.2 kg, last menstrual period 01/15/2019, SpO2 99 %. Physical Exam  Nursing note and vitals reviewed. Constitutional: She is oriented to person, place, and time. She appears well-developed and well-nourished.  HENT:  Head: Normocephalic and atraumatic.  Neck: Normal range of motion. Neck supple.  Cardiovascular: Normal rate and  regular rhythm.  Respiratory: Effort normal and breath sounds normal.  GI: Soft. Bowel sounds are normal.  Genitourinary:    Vagina and uterus normal.   Musculoskeletal: Normal range of motion.  Neurological: She is alert and oriented to person, place, and time. She has normal reflexes.  Skin: Skin is warm and dry.  Psychiatric: She has a normal mood and affect.    Results for orders placed or performed during the hospital encounter of 01/22/19 (from the past 24 hour(s))  Pregnancy, urine POC     Status: None   Collection Time: 01/22/19 12:04 PM  Result Value Ref Range   Preg Test, Ur NEGATIVE NEGATIVE    No results found.  Assessment/Plan: Refractory menorrhagia Uterine fibroids daVinci assisted TLH and Bilateral salpingectomy Surgical risks discussed in office. Consent done.  Deamonte Sayegh J 01/22/2019, 12:17 PM

## 2019-01-22 NOTE — Anesthesia Preprocedure Evaluation (Signed)
Anesthesia Evaluation  Patient identified by MRN, date of birth, ID band Patient awake    Reviewed: Allergy & Precautions, NPO status , Patient's Chart, lab work & pertinent test results  Airway Mallampati: II  TM Distance: >3 FB Neck ROM: Full    Dental  (+) Teeth Intact, Dental Advisory Given   Pulmonary    breath sounds clear to auscultation       Cardiovascular  Rhythm:Regular Rate:Normal     Neuro/Psych    GI/Hepatic   Endo/Other    Renal/GU      Musculoskeletal   Abdominal   Peds  Hematology   Anesthesia Other Findings   Reproductive/Obstetrics                             Anesthesia Physical Anesthesia Plan  ASA: I  Anesthesia Plan: General   Post-op Pain Management:    Induction: Intravenous  PONV Risk Score and Plan: Ondansetron and Dexamethasone  Airway Management Planned: Oral ETT  Additional Equipment:   Intra-op Plan:   Post-operative Plan: Extubation in OR  Informed Consent: I have reviewed the patients History and Physical, chart, labs and discussed the procedure including the risks, benefits and alternatives for the proposed anesthesia with the patient or authorized representative who has indicated his/her understanding and acceptance.     Dental advisory given  Plan Discussed with: Anesthesiologist and CRNA  Anesthesia Plan Comments:         Anesthesia Quick Evaluation

## 2019-01-22 NOTE — Transfer of Care (Signed)
Immediate Anesthesia Transfer of Care Note  Patient: Mallory Dunn  Procedure(s) Performed: XI ROBOTIC ASSISTED LAPAROSCOPIC HYSTERECTOMY AND SALPINGECTOMY (Bilateral Abdomen)  Patient Location: PACU  Anesthesia Type:General  Level of Consciousness: awake, alert  and oriented  Airway & Oxygen Therapy: Patient Spontanous Breathing and Patient connected to nasal cannula oxygen  Post-op Assessment: Report given to RN and Post -op Vital signs reviewed and stable  Post vital signs: Reviewed and stable  Last Vitals: 124/78,  Vitals Value Taken Time  BP    Temp    Pulse 88 01/22/19 1453  Resp 15 01/22/19 1453  SpO2 100 % 01/22/19 1453  Vitals shown include unvalidated device data.  Last Pain:  Vitals:   01/22/19 1157  TempSrc: Oral      Patients Stated Pain Goal: 6 (59/27/63 9432)  Complications: No apparent anesthesia complications

## 2019-01-23 DIAGNOSIS — N920 Excessive and frequent menstruation with regular cycle: Secondary | ICD-10-CM | POA: Diagnosis not present

## 2019-01-23 MED ORDER — OXYCODONE-ACETAMINOPHEN 5-325 MG PO TABS
ORAL_TABLET | ORAL | Status: AC
  Filled 2019-01-23: qty 2

## 2019-01-23 MED ORDER — KETOROLAC TROMETHAMINE 30 MG/ML IJ SOLN
INTRAMUSCULAR | Status: AC
  Filled 2019-01-23: qty 1

## 2019-01-23 MED ORDER — OXYCODONE-ACETAMINOPHEN 5-325 MG PO TABS: 1.0000 | ORAL_TABLET | ORAL | 0 refills | Status: AC | PRN

## 2019-01-23 NOTE — Progress Notes (Signed)
PCA dilaudid d/c, wasted 26 ml Reynold Bowen witnessed waste.

## 2019-01-23 NOTE — Discharge Instructions (Signed)
Laparoscopically Assisted Vaginal Hysterectomy, Care After °This sheet gives you information about how to care for yourself after your procedure. Your health care provider may also give you more specific instructions. If you have problems or questions, contact your health care provider. °What can I expect after the procedure? °After the procedure, it is common to have: °· Soreness and numbness in your incision areas. °· Abdominal pain. You will be given pain medicine to control it. °· Vaginal bleeding and discharge. You will need to use a sanitary napkin after this procedure. °· Sore throat from the breathing tube that was inserted during surgery. °Follow these instructions at home: °Medicines °· Take over-the-counter and prescription medicines only as told by your health care provider. °· Do not take aspirin or ibuprofen. These medicines can cause bleeding. °· Do not drive or use heavy machinery while taking prescription pain medicine. °· Do not drive for 24 hours if you were given a medicine to help you relax (sedative) during the procedure. °Incision care ° °· Follow instructions from your health care provider about how to take care of your incisions. Make sure you: °? Wash your hands with soap and water before you change your bandage (dressing). If soap and water are not available, use hand sanitizer. °? Change your dressing as told by your health care provider. °? Leave stitches (sutures), skin glue, or adhesive strips in place. These skin closures may need to stay in place for 2 weeks or longer. If adhesive strip edges start to loosen and curl up, you may trim the loose edges. Do not remove adhesive strips completely unless your health care provider tells you to do that. °· Check your incision area every day for signs of infection. Check for: °? Redness, swelling, or pain. °? Fluid or blood. °? Warmth. °? Pus or a bad smell. °Activity °· Get regular exercise as told by your health care provider. You may be  told to take short walks every day and go farther each time. °· Return to your normal activities as told by your health care provider. Ask your health care provider what activities are safe for you. °· Do not douche, use tampons, or have sexual intercourse for at least 6 weeks, or until your health care provider gives you permission. °· Do not lift anything that is heavier than 10 lb (4.5 kg), or the limit that your health care provider tells you, until he or she says that it is safe. °General instructions °· Do not take baths, swim, or use a hot tub until your health care provider approves. Take showers instead of baths. °· Do not drive for 24 hours if you received a sedative. °· Do not drive or operate heavy machinery while taking prescription pain medicine. °· To prevent or treat constipation while you are taking prescription pain medicine, your health care provider may recommend that you: °? Drink enough fluid to keep your urine clear or pale yellow. °? Take over-the-counter or prescription medicines. °? Eat foods that are high in fiber, such as fresh fruits and vegetables, whole grains, and beans. °? Limit foods that are high in fat and processed sugars, such as fried and sweet foods. °· Keep all follow-up visits as told by your health care provider. This is important. °Contact a health care provider if: °· You have signs of infection, such as: °? Redness, swelling, or pain around your incision sites. °? Fluid or blood coming from an incision. °? An incision that feels warm to the   touch. °? Pus or a bad smell coming from an incision. °· Your incision breaks open. °· Your pain medicine is not helping. °· You feel dizzy or light-headed. °· You have pain or bleeding when you urinate. °· You have persistent nausea and vomiting. °· You have blood, pus, or a bad-smelling discharge from your vagina. °Get help right away if: °· You have a fever. °· You have severe abdominal pain. °· You have chest pain. °· You have  shortness of breath. °· You faint. °· You have pain, swelling, or redness in your leg. °· You have heavy bleeding from your vagina. °Summary °· After the procedure, it is common to have abdominal pain and vaginal bleeding. °· You should not drive or lift heavy objects until your health care provider says that it is safe. °· Contact your health care provider if you have any symptoms of infection, excessive vaginal bleeding, nausea, vomiting, or shortness of breath. °This information is not intended to replace advice given to you by your health care provider. Make sure you discuss any questions you have with your health care provider. °Document Released: 07/08/2011 Document Revised: 09/14/2016 Document Reviewed: 09/14/2016 °[employer]vier Interactive Patient Education © 2019 [employer] ° °

## 2019-01-23 NOTE — Progress Notes (Signed)
1 Day Post-Op Procedure(s) (LRB): XI ROBOTIC ASSISTED LAPAROSCOPIC HYSTERECTOMY AND SALPINGECTOMY (Bilateral)  Subjective: Patient reports nausea, incisional pain, tolerating PO and no problems voiding.    Objective: BP 106/74 (BP Location: Right Arm)   Pulse 76   Temp 97.6 F (36.4 C)   Resp 18   Ht 5' 4.5" (1.638 m)   Wt 68.2 kg   LMP 01/15/2019   SpO2 100%   BMI 25.40 kg/m   CBC    Component Value Date/Time   WBC 10.4 01/22/2019 1717   RBC 3.88 01/22/2019 1717   HGB 11.3 (L) 01/22/2019 1717   HCT 35.4 (L) 01/22/2019 1717   PLT 208 01/22/2019 1717   MCV 91.2 01/22/2019 1717   MCH 29.1 01/22/2019 1717   MCHC 31.9 01/22/2019 1717   RDW 12.8 01/22/2019 1717    I have reviewed patient's vital signs, intake and output, medications and labs.  General: alert, cooperative and appears stated age Resp: clear to auscultation bilaterally and normal percussion bilaterally Cardio: regular rate and rhythm, S1, S2 normal, no murmur, click, rub or gallop GI: soft, non-tender; bowel sounds normal; no masses,  no organomegaly and incision: clean, dry and intact Extremities: extremities normal, atraumatic, no cyanosis or edema Vaginal Bleeding: minimal  Assessment: s/p Procedure(s): XI ROBOTIC ASSISTED LAPAROSCOPIC HYSTERECTOMY AND SALPINGECTOMY (Bilateral): stable  Plan: Advance diet Encourage ambulation Advance to PO medication Discontinue IV fluids  LOS: 0 days    Darryel Diodato J 01/23/2019, 6:39 AM

## 2019-01-24 ENCOUNTER — Encounter (HOSPITAL_BASED_OUTPATIENT_CLINIC_OR_DEPARTMENT_OTHER): Payer: Self-pay | Admitting: Obstetrics and Gynecology

## 2022-08-18 DIAGNOSIS — Z01419 Encounter for gynecological examination (general) (routine) without abnormal findings: Secondary | ICD-10-CM | POA: Diagnosis not present

## 2022-08-18 DIAGNOSIS — Z6827 Body mass index (BMI) 27.0-27.9, adult: Secondary | ICD-10-CM | POA: Diagnosis not present

## 2022-08-18 DIAGNOSIS — Z1231 Encounter for screening mammogram for malignant neoplasm of breast: Secondary | ICD-10-CM | POA: Diagnosis not present

## 2022-08-20 DIAGNOSIS — Z13220 Encounter for screening for lipoid disorders: Secondary | ICD-10-CM | POA: Diagnosis not present

## 2022-08-20 DIAGNOSIS — Z131 Encounter for screening for diabetes mellitus: Secondary | ICD-10-CM | POA: Diagnosis not present

## 2022-08-20 DIAGNOSIS — Z0000 Encounter for general adult medical examination without abnormal findings: Secondary | ICD-10-CM | POA: Diagnosis not present

## 2022-10-19 DIAGNOSIS — J4520 Mild intermittent asthma, uncomplicated: Secondary | ICD-10-CM | POA: Diagnosis not present

## 2022-10-19 DIAGNOSIS — Z7689 Persons encountering health services in other specified circumstances: Secondary | ICD-10-CM | POA: Diagnosis not present

## 2022-10-19 DIAGNOSIS — K582 Mixed irritable bowel syndrome: Secondary | ICD-10-CM | POA: Diagnosis not present

## 2022-10-19 DIAGNOSIS — Z13220 Encounter for screening for lipoid disorders: Secondary | ICD-10-CM | POA: Diagnosis not present

## 2022-10-19 DIAGNOSIS — J302 Other seasonal allergic rhinitis: Secondary | ICD-10-CM | POA: Diagnosis not present

## 2022-10-19 DIAGNOSIS — Z23 Encounter for immunization: Secondary | ICD-10-CM | POA: Diagnosis not present

## 2022-11-16 DIAGNOSIS — M25562 Pain in left knee: Secondary | ICD-10-CM | POA: Diagnosis not present

## 2022-11-16 DIAGNOSIS — M228X2 Other disorders of patella, left knee: Secondary | ICD-10-CM | POA: Diagnosis not present

## 2022-11-29 DIAGNOSIS — S86812A Strain of other muscle(s) and tendon(s) at lower leg level, left leg, initial encounter: Secondary | ICD-10-CM | POA: Diagnosis not present

## 2022-12-16 DIAGNOSIS — M25562 Pain in left knee: Secondary | ICD-10-CM | POA: Diagnosis not present

## 2022-12-20 DIAGNOSIS — M25562 Pain in left knee: Secondary | ICD-10-CM | POA: Diagnosis not present

## 2022-12-22 DIAGNOSIS — M25562 Pain in left knee: Secondary | ICD-10-CM | POA: Diagnosis not present

## 2022-12-28 DIAGNOSIS — M25562 Pain in left knee: Secondary | ICD-10-CM | POA: Diagnosis not present
